# Patient Record
Sex: Female | Born: 1950 | Race: White | Hispanic: No | Marital: Married | Smoking: Former smoker
Health system: Southern US, Community
[De-identification: ages and names within clinical notes are randomized; demographics above are authoritative.]

## PROBLEM LIST (undated history)

## (undated) DIAGNOSIS — K635 Polyp of colon: Secondary | ICD-10-CM

## (undated) DIAGNOSIS — H409 Unspecified glaucoma: Secondary | ICD-10-CM

## (undated) DIAGNOSIS — T7840XA Allergy, unspecified, initial encounter: Secondary | ICD-10-CM

## (undated) DIAGNOSIS — H269 Unspecified cataract: Secondary | ICD-10-CM

## (undated) HISTORY — DX: Unspecified cataract: H26.9

## (undated) HISTORY — PX: BREAST EXCISIONAL BIOPSY: SUR124

## (undated) HISTORY — DX: Polyp of colon: K63.5

## (undated) HISTORY — DX: Allergy, unspecified, initial encounter: T78.40XA

## (undated) HISTORY — DX: Unspecified glaucoma: H40.9

## (undated) HISTORY — PX: COLONOSCOPY: SHX174

## (undated) HISTORY — PX: BREAST CYST ASPIRATION: SHX578

---

## 1999-09-01 ENCOUNTER — Encounter: Payer: Self-pay | Admitting: Gynecology

## 1999-09-01 ENCOUNTER — Encounter: Admission: RE | Admit: 1999-09-01 | Discharge: 1999-09-01 | Payer: Self-pay | Admitting: Gynecology

## 2000-09-05 ENCOUNTER — Other Ambulatory Visit: Admission: RE | Admit: 2000-09-05 | Discharge: 2000-09-05 | Payer: Self-pay | Admitting: Gynecology

## 2000-09-05 ENCOUNTER — Encounter: Admission: RE | Admit: 2000-09-05 | Discharge: 2000-09-05 | Payer: Self-pay | Admitting: Gynecology

## 2000-09-05 ENCOUNTER — Encounter: Payer: Self-pay | Admitting: Gynecology

## 2001-09-06 ENCOUNTER — Other Ambulatory Visit: Admission: RE | Admit: 2001-09-06 | Discharge: 2001-09-06 | Payer: Self-pay | Admitting: Gynecology

## 2001-11-10 ENCOUNTER — Encounter: Admission: RE | Admit: 2001-11-10 | Discharge: 2001-11-10 | Payer: Self-pay | Admitting: Gynecology

## 2001-11-10 ENCOUNTER — Encounter: Payer: Self-pay | Admitting: Gynecology

## 2001-11-15 ENCOUNTER — Encounter: Payer: Self-pay | Admitting: Gynecology

## 2001-11-15 ENCOUNTER — Encounter: Admission: RE | Admit: 2001-11-15 | Discharge: 2001-11-15 | Payer: Self-pay | Admitting: Gynecology

## 2002-09-12 ENCOUNTER — Other Ambulatory Visit: Admission: RE | Admit: 2002-09-12 | Discharge: 2002-09-12 | Payer: Self-pay | Admitting: Gynecology

## 2002-11-20 ENCOUNTER — Encounter: Admission: RE | Admit: 2002-11-20 | Discharge: 2002-11-20 | Payer: Self-pay | Admitting: Gynecology

## 2003-12-04 ENCOUNTER — Other Ambulatory Visit: Admission: RE | Admit: 2003-12-04 | Discharge: 2003-12-04 | Payer: Self-pay | Admitting: Gynecology

## 2003-12-04 ENCOUNTER — Encounter: Admission: RE | Admit: 2003-12-04 | Discharge: 2003-12-04 | Payer: Self-pay | Admitting: Gynecology

## 2004-12-23 ENCOUNTER — Other Ambulatory Visit: Admission: RE | Admit: 2004-12-23 | Discharge: 2004-12-23 | Payer: Self-pay | Admitting: Gynecology

## 2004-12-23 ENCOUNTER — Encounter: Admission: RE | Admit: 2004-12-23 | Discharge: 2004-12-23 | Payer: Self-pay | Admitting: Gynecology

## 2006-02-08 ENCOUNTER — Other Ambulatory Visit: Admission: RE | Admit: 2006-02-08 | Discharge: 2006-02-08 | Payer: Self-pay | Admitting: Gynecology

## 2006-02-08 ENCOUNTER — Encounter: Admission: RE | Admit: 2006-02-08 | Discharge: 2006-02-08 | Payer: Self-pay | Admitting: Gynecology

## 2007-02-28 ENCOUNTER — Encounter: Admission: RE | Admit: 2007-02-28 | Discharge: 2007-02-28 | Payer: Self-pay | Admitting: Gynecology

## 2008-03-04 ENCOUNTER — Encounter: Admission: RE | Admit: 2008-03-04 | Discharge: 2008-03-04 | Payer: Self-pay | Admitting: Gynecology

## 2009-03-06 ENCOUNTER — Encounter: Admission: RE | Admit: 2009-03-06 | Discharge: 2009-03-06 | Payer: Self-pay | Admitting: Gynecology

## 2010-02-07 ENCOUNTER — Encounter: Payer: Self-pay | Admitting: Gynecology

## 2010-09-24 ENCOUNTER — Other Ambulatory Visit: Payer: Self-pay | Admitting: Gynecology

## 2010-09-24 DIAGNOSIS — Z1231 Encounter for screening mammogram for malignant neoplasm of breast: Secondary | ICD-10-CM

## 2010-10-07 ENCOUNTER — Ambulatory Visit
Admission: RE | Admit: 2010-10-07 | Discharge: 2010-10-07 | Disposition: A | Discharge status: Home / Self Care | Payer: BC Managed Care – PPO | Source: Ambulatory Visit | Attending: Gynecology | Admitting: Gynecology

## 2010-10-07 DIAGNOSIS — Z1231 Encounter for screening mammogram for malignant neoplasm of breast: Secondary | ICD-10-CM

## 2011-10-01 ENCOUNTER — Other Ambulatory Visit: Payer: Self-pay | Admitting: Gynecology

## 2011-10-01 DIAGNOSIS — Z1231 Encounter for screening mammogram for malignant neoplasm of breast: Secondary | ICD-10-CM

## 2011-10-14 ENCOUNTER — Ambulatory Visit
Admission: RE | Admit: 2011-10-14 | Discharge: 2011-10-14 | Disposition: A | Discharge status: Home / Self Care | Payer: BC Managed Care – PPO | Source: Ambulatory Visit | Attending: Gynecology | Admitting: Gynecology

## 2011-10-14 DIAGNOSIS — Z1231 Encounter for screening mammogram for malignant neoplasm of breast: Secondary | ICD-10-CM

## 2014-12-16 ENCOUNTER — Other Ambulatory Visit: Payer: Self-pay

## 2014-12-16 DIAGNOSIS — Z1231 Encounter for screening mammogram for malignant neoplasm of breast: Secondary | ICD-10-CM

## 2014-12-17 ENCOUNTER — Ambulatory Visit
Admission: RE | Admit: 2014-12-17 | Discharge: 2014-12-17 | Disposition: A | Discharge status: Home / Self Care | Payer: BLUE CROSS/BLUE SHIELD | Source: Ambulatory Visit

## 2014-12-17 DIAGNOSIS — Z1231 Encounter for screening mammogram for malignant neoplasm of breast: Secondary | ICD-10-CM

## 2014-12-18 ENCOUNTER — Other Ambulatory Visit: Payer: Self-pay | Admitting: Internal Medicine

## 2014-12-18 DIAGNOSIS — R928 Other abnormal and inconclusive findings on diagnostic imaging of breast: Secondary | ICD-10-CM

## 2014-12-26 ENCOUNTER — Ambulatory Visit
Admission: RE | Admit: 2014-12-26 | Discharge: 2014-12-26 | Disposition: A | Discharge status: Home / Self Care | Payer: BLUE CROSS/BLUE SHIELD | Source: Ambulatory Visit | Attending: Internal Medicine | Admitting: Internal Medicine

## 2014-12-26 DIAGNOSIS — R928 Other abnormal and inconclusive findings on diagnostic imaging of breast: Secondary | ICD-10-CM

## 2015-12-15 ENCOUNTER — Other Ambulatory Visit: Payer: Self-pay | Admitting: Internal Medicine

## 2015-12-15 DIAGNOSIS — Z1231 Encounter for screening mammogram for malignant neoplasm of breast: Secondary | ICD-10-CM

## 2016-01-23 ENCOUNTER — Ambulatory Visit: Payer: BLUE CROSS/BLUE SHIELD

## 2016-03-04 ENCOUNTER — Ambulatory Visit
Admission: RE | Admit: 2016-03-04 | Discharge: 2016-03-04 | Disposition: A | Discharge status: Home / Self Care | Payer: BLUE CROSS/BLUE SHIELD | Source: Ambulatory Visit | Attending: Internal Medicine | Admitting: Internal Medicine

## 2016-03-04 DIAGNOSIS — Z1231 Encounter for screening mammogram for malignant neoplasm of breast: Secondary | ICD-10-CM

## 2017-04-13 ENCOUNTER — Other Ambulatory Visit: Payer: Self-pay | Admitting: Internal Medicine

## 2017-04-13 DIAGNOSIS — Z1231 Encounter for screening mammogram for malignant neoplasm of breast: Secondary | ICD-10-CM

## 2017-05-06 ENCOUNTER — Ambulatory Visit
Admission: RE | Admit: 2017-05-06 | Discharge: 2017-05-06 | Disposition: A | Discharge status: Home / Self Care | Payer: BLUE CROSS/BLUE SHIELD | Source: Ambulatory Visit | Attending: Internal Medicine | Admitting: Internal Medicine

## 2017-05-06 DIAGNOSIS — Z1231 Encounter for screening mammogram for malignant neoplasm of breast: Secondary | ICD-10-CM

## 2017-08-08 ENCOUNTER — Encounter: Payer: Self-pay | Admitting: Gastroenterology

## 2017-09-21 ENCOUNTER — Ambulatory Visit (INDEPENDENT_AMBULATORY_CARE_PROVIDER_SITE_OTHER): Payer: BLUE CROSS/BLUE SHIELD | Admitting: Gastroenterology

## 2017-09-21 ENCOUNTER — Encounter: Payer: Self-pay | Admitting: Gastroenterology

## 2017-09-21 ENCOUNTER — Other Ambulatory Visit (INDEPENDENT_AMBULATORY_CARE_PROVIDER_SITE_OTHER): Payer: BLUE CROSS/BLUE SHIELD

## 2017-09-21 ENCOUNTER — Encounter

## 2017-09-21 VITALS — BP 116/74 | HR 76 | Ht 68.0 in | Wt 141.0 lb

## 2017-09-21 DIAGNOSIS — R634 Abnormal weight loss: Secondary | ICD-10-CM

## 2017-09-21 DIAGNOSIS — R1032 Left lower quadrant pain: Secondary | ICD-10-CM | POA: Diagnosis not present

## 2017-09-21 LAB — CBC WITH DIFFERENTIAL/PLATELET
BASOS PCT: 0.4 % (ref 0.0–3.0)
Basophils Absolute: 0 10*3/uL (ref 0.0–0.1)
EOS ABS: 0.1 10*3/uL (ref 0.0–0.7)
Eosinophils Relative: 2.5 % (ref 0.0–5.0)
HCT: 41 % (ref 36.0–46.0)
Hemoglobin: 14 g/dL (ref 12.0–15.0)
Lymphocytes Relative: 30.6 % (ref 12.0–46.0)
Lymphs Abs: 1.4 10*3/uL (ref 0.7–4.0)
MCHC: 34.2 g/dL (ref 30.0–36.0)
MCV: 91 fl (ref 78.0–100.0)
MONO ABS: 0.3 10*3/uL (ref 0.1–1.0)
Monocytes Relative: 7.4 % (ref 3.0–12.0)
NEUTROS ABS: 2.7 10*3/uL (ref 1.4–7.7)
Neutrophils Relative %: 59.1 % (ref 43.0–77.0)
PLATELETS: 380 10*3/uL (ref 150.0–400.0)
RBC: 4.5 Mil/uL (ref 3.87–5.11)
RDW: 13.1 % (ref 11.5–15.5)
WBC: 4.6 10*3/uL (ref 4.0–10.5)

## 2017-09-21 LAB — COMPREHENSIVE METABOLIC PANEL
ALBUMIN: 4.8 g/dL (ref 3.5–5.2)
ALT: 27 U/L (ref 0–35)
AST: 25 U/L (ref 0–37)
Alkaline Phosphatase: 53 U/L (ref 39–117)
BUN: 16 mg/dL (ref 6–23)
CHLORIDE: 101 meq/L (ref 96–112)
CO2: 30 meq/L (ref 19–32)
CREATININE: 0.68 mg/dL (ref 0.40–1.20)
Calcium: 9.7 mg/dL (ref 8.4–10.5)
GFR: 92.32 mL/min (ref >60.00)
GLUCOSE: 99 mg/dL (ref 70–99)
Potassium: 4 mEq/L (ref 3.5–5.1)
SODIUM: 137 meq/L (ref 135–145)
Total Bilirubin: 0.5 mg/dL (ref 0.2–1.2)
Total Protein: 7.1 g/dL (ref 6.0–8.3)

## 2017-09-21 LAB — TSH: TSH: 3.31 u[IU]/mL (ref 0.35–4.50)

## 2017-09-21 MED ORDER — DICYCLOMINE HCL 20 MG PO TABS: 20.0000 mg | ORAL_TABLET | Freq: Two times a day (BID) | ORAL | 4 refills | Status: DC

## 2017-09-21 MED ORDER — DICYCLOMINE HCL 10 MG PO CAPS: 10.0000 mg | ORAL_CAPSULE | Freq: Two times a day (BID) | ORAL | 0 refills | Status: DC

## 2017-09-21 NOTE — Addendum Note (Signed)
Addended by: Karena Addison on: 09/21/2017 04:35 PM   Modules accepted: Orders

## 2017-09-21 NOTE — Patient Instructions (Signed)
If you are age 65 or older, your body mass index should be between 23-30. Your Body mass index is 21.44 kg/m. If this is out of the aforementioned range listed, please consider follow up with your Primary Care Provider.  If you are age 51 or younger, your body mass index should be between 19-25. Your Body mass index is 21.44 kg/m. If this is out of the aformentioned range listed, please consider follow up with your Primary Care Provider.  Please go to the lab on the 2nd floor [address] before you leave the office today.     You have been scheduled for a CT scan of the abdomen and pelvis at [facility] are scheduled on 09/28/17 at [facility] should arrive 15 minutes prior to your appointment time for registration. Please follow the written instructions below on the day of your exam:  WARNING: IF YOU ARE ALLERGIC TO IODINE/X-RAY DYE, PLEASE NOTIFY RADIOLOGY IMMEDIATELY AT [phone]! YOU WILL BE GIVEN A 13 HOUR PREMEDICATION PREP.  1) Do not eat or drink anything after 6am (4 hours prior to your test) 2) You have been given 2 bottles of oral contrast to drink. The solution may taste better if refrigerated, but do NOT add ice or any other liquid to this solution. Shake well before drinking.    Drink 1 bottle of contrast @ 8am (2 hours prior to your exam)  Drink 1 bottle of contrast @ 9am (1 hour prior to your exam)  You may take any medications as prescribed with a small amount of water except for the following: Metformin, Glucophage, Glucovance, Avandamet, Riomet, Fortamet, Actoplus Met, Janumet, Glumetza or Metaglip. The above medications must be held the day of the exam AND 48 hours after the exam.  The purpose of you drinking the oral contrast is to aid in the visualization of your intestinal tract. The contrast solution may cause some diarrhea. Before your exam is started, you will be given a small amount of fluid to drink. Depending on your individual set of symptoms, you may  also receive an intravenous injection of x-ray contrast/dye. Plan on being at [facility] for 30 minutes or longer, depending on the type of exam you are having performed.  This test typically takes 30-45 minutes to complete.  If you have any questions regarding your exam or if you need to reschedule, you may call the CT department at [phone] between the hours of 8:00 am and 5:00 pm, Monday-Friday.  ________________________________________________________________________  Please go to the lab on the 2nd floor [address] before you leave the office today.   We have sent the following medications to your pharmacy for you to pick up at your convenience: Bentyl  Thank you,  Dr. Jackquline Denmark

## 2017-09-21 NOTE — Progress Notes (Signed)
Chief Complaint: Abdominal pain  Referring Provider:  Dr Delena Bali      ASSESSMENT AND PLAN;   #1.  LLQ pain with intermittent diarrhea and weight loss. - Check CBC, CMP, celiac screen and TSH. - Proceed with CT scan abdomen and pelvis with p.o. and IV contrast. - Please obtain previous records (colon  - Decrease Bentyl 10mg  po bid - fu in 12 weeks.   #2. H/O colonic polyps (colonoscopy 12/02/2014 1cm proximal transverse colon tubular adenoma) -At follow-up she would need to be scheduled for colonoscopy.  She understands the risks and benefits.   HPI:    HAYDEE JABBOUR is a 67 y.o. female  Abdominal pain since June 2019-mostly left lower quadrant, somewhat relieved by defecation, associated abdominal bloating, worse after eating, no nocturnal symptoms With intermittent diarrhea, no nocturnal symptoms No fever chills melena or hematochezia. Seen by Dr Delena Bali- neg TVUS.  Started on Bentyl 20mg  po QID with some relief. Lost weight 8lb since June. Denies having any significant nausea, vomiting, heartburn, regurgitation, odynophagia or dysphagia. No recent history of travel, antibiotics, no history suggestive of lactose intolerance  Last colonoscopy 12/02/2014-colonic polyps (TA), mild sigmoid diverticulosis. Past Medical History:  Diagnosis Date  . Glaucoma    left and maybe in the right per pt    Past Surgical History:  Procedure Laterality Date  . BREAST CYST ASPIRATION    . BREAST EXCISIONAL BIOPSY      Family History  Problem Relation Age of Onset  . Congestive Heart Failure Mother   . Heart attack Mother   . Colonic polyp Father   . Congestive Heart Failure Father        Died at 35  . Colon cancer Neg Hx   . Esophageal cancer Neg Hx     Social History   Tobacco Use  . Smoking status: Former Research scientist (life sciences)  . Smokeless tobacco: Never Used  . Tobacco comment: smoked when younger  Substance Use Topics  . Alcohol use: Yes  . Drug use: Never    Current  Outpatient Medications  Medication Sig Dispense Refill  . LUMIGAN 0.01 % SOLN 1 drop at bedtime.  5  . Misc Natural Products (OSTEO BI-FLEX/5-LOXIN ADVANCED PO) Take 2 tablets by mouth daily.    . Multiple Vitamins-Minerals (MULTIVITAMIN ADULT PO) Take 1 tablet by mouth daily.    Marland Kitchen dicyclomine (BENTYL) 20 MG tablet Take 20 mg by mouth 4 (four) times daily.  0   No current facility-administered medications for this visit.     Not on File  Review of Systems:  Constitutional: Denies fever, chills, diaphoresis, appetite change, has fatigue.  HEENT: Denies photophobia, eye pain, redness, hearing loss, ear pain, congestion, sore throat, rhinorrhea, sneezing, mouth sores, neck pain, neck stiffness and tinnitus.   Respiratory: Denies SOB, DOE, cough, chest tightness,  and wheezing.   Cardiovascular: Denies chest pain, palpitations and leg swelling.  Genitourinary: Denies dysuria, urgency, frequency, hematuria, flank pain and difficulty urinating.  Musculoskeletal: Denies myalgias, back pain, joint swelling, arthralgias and gait problem.  Skin: No rash.  Neurological: Denies dizziness, seizures, syncope, weakness, light-headedness, numbness and headaches.  Hematological: Denies adenopathy. Easy bruising, personal or family bleeding history  Psychiatric/Behavioral: No anxiety or depression     Physical Exam:    BP 116/74   Pulse 76   Ht 5\' 8"  (1.727 m)   Wt 141 lb (64 kg)   BMI 21.44 kg/m  Filed Weights   09/21/17 1109  Weight: 141 lb (  64 kg)   Constitutional:  Well-developed, in no acute distress. Psychiatric: Normal mood and affect. Behavior is normal. HEENT: Pupils normal.  Conjunctivae are normal. No scleral icterus. Neck supple.  Cardiovascular: Normal rate, regular rhythm. No edema Pulmonary/chest: Effort normal and breath sounds normal. No wheezing, rales or rhonchi. Abdominal: Soft, nondistended.  Mild left lower quadrant abdominal tenderness. Bowel sounds active throughout.  There are no masses palpable. No hepatomegaly. Rectal:  defered Neurological: Alert and oriented to person place and time. Skin: Skin is warm and dry. No rashes noted.    Carmell Austria, MD 09/21/2017, 11:24 AM  Cc: Dr Delena Bali

## 2017-09-22 LAB — TISSUE TRANSGLUTAMINASE, IGA: (tTG) Ab, IgA: 1 U/mL

## 2017-09-22 LAB — IGG: IGG (IMMUNOGLOBIN G), SERUM: 904 mg/dL (ref 600–1540)

## 2017-09-23 ENCOUNTER — Telehealth: Payer: Self-pay | Admitting: Gastroenterology

## 2017-09-23 NOTE — Telephone Encounter (Signed)
Please see note below. I do not have the number for the pt to call and reschedule the CT scan. Pt just saw Dr. Lyndel Safe 09/21/17.

## 2017-09-28 ENCOUNTER — Ambulatory Visit (HOSPITAL_BASED_OUTPATIENT_CLINIC_OR_DEPARTMENT_OTHER): Payer: BLUE CROSS/BLUE SHIELD

## 2017-10-25 ENCOUNTER — Encounter: Payer: Self-pay | Admitting: Gastroenterology

## 2017-10-26 ENCOUNTER — Ambulatory Visit (HOSPITAL_BASED_OUTPATIENT_CLINIC_OR_DEPARTMENT_OTHER)
Admission: RE | Admit: 2017-10-26 | Discharge: 2017-10-26 | Disposition: A | Discharge status: Home / Self Care | Payer: Medicare HMO | Source: Ambulatory Visit | Attending: Gastroenterology | Admitting: Gastroenterology

## 2017-10-26 ENCOUNTER — Encounter (HOSPITAL_BASED_OUTPATIENT_CLINIC_OR_DEPARTMENT_OTHER): Payer: Self-pay

## 2017-10-26 DIAGNOSIS — R1032 Left lower quadrant pain: Secondary | ICD-10-CM | POA: Diagnosis not present

## 2017-10-26 DIAGNOSIS — N281 Cyst of kidney, acquired: Secondary | ICD-10-CM | POA: Diagnosis not present

## 2017-10-26 DIAGNOSIS — R634 Abnormal weight loss: Secondary | ICD-10-CM | POA: Diagnosis not present

## 2017-10-26 DIAGNOSIS — I700 Atherosclerosis of aorta: Secondary | ICD-10-CM | POA: Insufficient documentation

## 2017-10-26 MED ORDER — IOPAMIDOL (ISOVUE-300) INJECTION 61%
100.0000 mL | Freq: Once | INTRAVENOUS | Status: AC | PRN
  Administered 2017-10-26: 100 mL via INTRAVENOUS

## 2017-11-03 ENCOUNTER — Telehealth: Payer: Self-pay | Admitting: Gastroenterology

## 2017-11-03 NOTE — Telephone Encounter (Signed)
Pt wanted to know if she needed to still have colonoscopy since she had already had CT scan done. Let her know that the ct does not show the lining of the colon so we would not know if there were any polyps present. She verbalized understanding and will call back to schedule colon.

## 2017-11-07 DIAGNOSIS — D2239 Melanocytic nevi of other parts of face: Secondary | ICD-10-CM | POA: Diagnosis not present

## 2017-11-08 ENCOUNTER — Encounter: Payer: Self-pay | Admitting: Gastroenterology

## 2017-11-18 DIAGNOSIS — H401132 Primary open-angle glaucoma, bilateral, moderate stage: Secondary | ICD-10-CM | POA: Diagnosis not present

## 2017-12-02 DIAGNOSIS — M67911 Unspecified disorder of synovium and tendon, right shoulder: Secondary | ICD-10-CM | POA: Diagnosis not present

## 2017-12-08 DIAGNOSIS — M25511 Pain in right shoulder: Secondary | ICD-10-CM | POA: Diagnosis not present

## 2017-12-08 DIAGNOSIS — M25611 Stiffness of right shoulder, not elsewhere classified: Secondary | ICD-10-CM | POA: Diagnosis not present

## 2017-12-12 ENCOUNTER — Encounter: Payer: Self-pay | Admitting: Gastroenterology

## 2017-12-12 ENCOUNTER — Ambulatory Visit (AMBULATORY_SURGERY_CENTER): Payer: Self-pay | Admitting: *Deleted

## 2017-12-12 ENCOUNTER — Other Ambulatory Visit: Payer: Self-pay

## 2017-12-12 VITALS — Ht 67.5 in | Wt 146.0 lb

## 2017-12-12 DIAGNOSIS — M25511 Pain in right shoulder: Secondary | ICD-10-CM | POA: Diagnosis not present

## 2017-12-12 DIAGNOSIS — Z86010 Personal history of colonic polyps: Secondary | ICD-10-CM

## 2017-12-12 DIAGNOSIS — M25611 Stiffness of right shoulder, not elsewhere classified: Secondary | ICD-10-CM | POA: Diagnosis not present

## 2017-12-12 MED ORDER — SUPREP BOWEL PREP KIT 17.5-3.13-1.6 GM/177ML PO SOLN: 1.0000 | Freq: Once | ORAL | 0 refills | Status: AC

## 2017-12-12 NOTE — Progress Notes (Signed)
No egg or soy allergy known to patient  No issues with past sedation with any surgeries  or procedures, no intubation problems  No diet pills per patient No home 02 use per patient  No blood thinners per patient  Pt denies issues with constipation, patient takes fiber supplement for fiber, not constipation. Will stop 5 days prior.  No A fib or A flutter  EMMI video offered and declined by the patient.

## 2017-12-26 ENCOUNTER — Encounter: Payer: Self-pay | Admitting: Gastroenterology

## 2017-12-26 ENCOUNTER — Ambulatory Visit (AMBULATORY_SURGERY_CENTER): Payer: Medicare HMO | Admitting: Gastroenterology

## 2017-12-26 VITALS — BP 124/55 | HR 63 | Temp 97.1°F | Resp 10 | Ht 67.5 in | Wt 146.0 lb

## 2017-12-26 DIAGNOSIS — Z86010 Personal history of colonic polyps: Secondary | ICD-10-CM | POA: Diagnosis not present

## 2017-12-26 DIAGNOSIS — Z800 Family history of malignant neoplasm of digestive organs: Secondary | ICD-10-CM

## 2017-12-26 MED ORDER — SODIUM CHLORIDE 0.9 % IV SOLN: 500.0000 mL | Freq: Once | INTRAVENOUS | Status: DC

## 2017-12-26 NOTE — Patient Instructions (Signed)
Discharge instructions given. Handouts on Diverticulosis and hemorrhoids. Resume previous medications. YOU HAD AN ENDOSCOPIC PROCEDURE TODAY AT [facility]:   Refer to the procedure report that was given to you for any specific questions about what was found during the examination.  If the procedure report does not answer your questions, please call your gastroenterologist to clarify.  If you requested that your care partner not be given the details of your procedure findings, then the procedure report has been included in a sealed envelope for you to review at your convenience later.  YOU SHOULD EXPECT: Some feelings of bloating in the abdomen. Passage of more gas than usual.  Walking can help get rid of the air that was put into your GI tract during the procedure and reduce the bloating. If you had a lower endoscopy (such as a colonoscopy or flexible sigmoidoscopy) you may notice spotting of blood in your stool or on the toilet paper. If you underwent a bowel prep for your procedure, you may not have a normal bowel movement for a few days.  Please Note:  You might notice some irritation and congestion in your nose or some drainage.  This is from the oxygen used during your procedure.  There is no need for concern and it should clear up in a day or so.  SYMPTOMS TO REPORT IMMEDIATELY:   Following lower endoscopy (colonoscopy or flexible sigmoidoscopy):  Excessive amounts of blood in the stool  Significant tenderness or worsening of abdominal pains  Swelling of the abdomen that is new, acute  Fever of 100F or higher   For urgent or emergent issues, a gastroenterologist can be reached at any hour by calling [phone].   DIET:  We do recommend a small meal at first, but then you may proceed to your regular diet.  Drink plenty of fluids but you should avoid alcoholic beverages for 24 hours.  ACTIVITY:  You should plan to take it easy for the rest of today and you should  NOT DRIVE or use heavy machinery until tomorrow (because of the sedation medicines used during the test).    FOLLOW UP: Our staff will call the number listed on your records the next business day following your procedure to check on you and address any questions or concerns that you may have regarding the information given to you following your procedure. If we do not reach you, we will leave a message.  However, if you are feeling well and you are not experiencing any problems, there is no need to return our call.  We will assume that you have returned to your regular daily activities without incident.  If any biopsies were taken you will be contacted by phone or by letter within the next 1-3 weeks.  Please call us at [phone] if you have not heard about the biopsies in 3 weeks.    SIGNATURES/CONFIDENTIALITY: You and/or your care partner have signed paperwork which will be entered into your electronic medical record.  These signatures attest to the fact that that the information above on your After Visit Summary has been reviewed and is understood.  Full responsibility of the confidentiality of this discharge information lies with you and/or your care-partner.

## 2017-12-26 NOTE — Progress Notes (Signed)
Report given to PACU, vss 

## 2017-12-26 NOTE — Op Note (Signed)
[name] Procedure Date: 12/26/2017 10:32 AM MRN: [id number] Endoscopist: Jackquline Denmark , MD Age: 67 Referring MD:  Date of Birth: 12/07/1950 Gender: Female Account #: [id number] Procedure:                Colonoscopy Indications:              High risk colon cancer surveillance: Personal                            history of colonic polyps 11/2014. Family history                            of colonic polyps. Medicines:                Monitored Anesthesia Care Procedure:                Pre-Anesthesia Assessment:                           - Prior to the procedure, a History and Physical                            was performed, and patient medications and                            allergies were reviewed. The patient's tolerance of                            previous anesthesia was also reviewed. The risks                            and benefits of the procedure and the sedation                            options and risks were discussed with the patient.                            All questions were answered, and informed consent                            was obtained. Prior Anticoagulants: The patient has                            taken no previous anticoagulant or antiplatelet                            agents. ASA Grade Assessment: I - A normal, healthy                            patient. After reviewing the risks and benefits,                            the patient was deemed in satisfactory condition to  undergo the procedure.                           After obtaining informed consent, the colonoscope                            was passed under direct vision. Throughout the                            procedure, the patient's blood pressure, pulse, and                            oxygen saturations were monitored continuously. The                            Model CF-HQ190L [id number]) scope was introduced                       through the anus and advanced to the 2 cm into the                            ileum. The colonoscopy was performed without                            difficulty. The patient tolerated the procedure                            well. The quality of the bowel preparation was                            excellent. The colon was redundant. Scope In: 10:41:26 AM Scope Out: 10:53:05 AM Scope Withdrawal Time: 0 hours 5 minutes 57 seconds  Total Procedure Duration: 0 hours 11 minutes 39 seconds  Findings:                 A few rare small-mouthed diverticula were found in                            the sigmoid colon.                           Non-bleeding internal hemorrhoids were found during                            retroflexion. The hemorrhoids were small.                           The exam was otherwise without abnormality on                            direct and retroflexion views. Complications:            No immediate complications. Estimated Blood Loss:     Estimated blood loss: none. Impression:               -Minimal sigmoid diverticulosis.                           -  Small internal hemorrhoids.                           -Otherwise normal colonoscopy to TI. Recommendation:           - Patient has a contact number available for                            emergencies. The signs and symptoms of potential                            delayed complications were discussed with the                            patient. Return to normal activities tomorrow.                            Written discharge instructions were provided to the                            patient.                           - Resume previous diet.                           - Continue present medications.                           - Repeat colonoscopy in 10 years for screening                            purposes. Earlier, if with any new problems or if                            there is any change in family  history.                           - Return to GI clinic PRN. Jackquline Denmark, MD 12/26/2017 10:57:35 AM This report has been signed electronically.

## 2017-12-27 ENCOUNTER — Telehealth: Payer: Self-pay

## 2017-12-27 DIAGNOSIS — J019 Acute sinusitis, unspecified: Secondary | ICD-10-CM | POA: Diagnosis not present

## 2017-12-27 NOTE — Telephone Encounter (Signed)
  Follow up Call-  Call back number 12/26/2017  Post procedure Call Back phone  # [phone]  Permission to leave phone message Yes  Some recent data might be hidden     Patient questions:  Do you have a fever, pain , or abdominal swelling? No. Pain Score  0 *  Have you tolerated food without any problems? Yes.    Have you been able to return to your normal activities? Yes.    Do you have any questions about your discharge instructions: Diet   No. Medications  No. Follow up visit  No.  Do you have questions or concerns about your Care? No.  Actions: * If pain score is 4 or above: No action needed, pain <4.

## 2018-01-30 DIAGNOSIS — H9191 Unspecified hearing loss, right ear: Secondary | ICD-10-CM | POA: Diagnosis not present

## 2018-01-30 DIAGNOSIS — H6121 Impacted cerumen, right ear: Secondary | ICD-10-CM | POA: Diagnosis not present

## 2018-01-30 DIAGNOSIS — H938X1 Other specified disorders of right ear: Secondary | ICD-10-CM | POA: Diagnosis not present

## 2018-01-30 DIAGNOSIS — M26609 Unspecified temporomandibular joint disorder, unspecified side: Secondary | ICD-10-CM | POA: Diagnosis not present

## 2018-01-30 DIAGNOSIS — J358 Other chronic diseases of tonsils and adenoids: Secondary | ICD-10-CM | POA: Diagnosis not present

## 2018-01-30 DIAGNOSIS — J069 Acute upper respiratory infection, unspecified: Secondary | ICD-10-CM | POA: Diagnosis not present

## 2018-04-28 DIAGNOSIS — L255 Unspecified contact dermatitis due to plants, except food: Secondary | ICD-10-CM | POA: Diagnosis not present

## 2018-07-07 DIAGNOSIS — H25813 Combined forms of age-related cataract, bilateral: Secondary | ICD-10-CM | POA: Diagnosis not present

## 2018-08-11 DIAGNOSIS — Z01419 Encounter for gynecological examination (general) (routine) without abnormal findings: Secondary | ICD-10-CM | POA: Diagnosis not present

## 2018-08-14 DIAGNOSIS — H25812 Combined forms of age-related cataract, left eye: Secondary | ICD-10-CM | POA: Diagnosis not present

## 2018-08-14 DIAGNOSIS — H25811 Combined forms of age-related cataract, right eye: Secondary | ICD-10-CM | POA: Diagnosis not present

## 2018-08-14 DIAGNOSIS — H25813 Combined forms of age-related cataract, bilateral: Secondary | ICD-10-CM | POA: Diagnosis not present

## 2018-08-14 DIAGNOSIS — H33311 Horseshoe tear of retina without detachment, right eye: Secondary | ICD-10-CM | POA: Diagnosis not present

## 2018-08-14 DIAGNOSIS — H401131 Primary open-angle glaucoma, bilateral, mild stage: Secondary | ICD-10-CM | POA: Diagnosis not present

## 2018-09-01 DIAGNOSIS — Z01818 Encounter for other preprocedural examination: Secondary | ICD-10-CM | POA: Diagnosis not present

## 2018-09-01 DIAGNOSIS — H409 Unspecified glaucoma: Secondary | ICD-10-CM | POA: Diagnosis not present

## 2018-09-01 DIAGNOSIS — H401131 Primary open-angle glaucoma, bilateral, mild stage: Secondary | ICD-10-CM | POA: Diagnosis not present

## 2018-09-01 DIAGNOSIS — H401111 Primary open-angle glaucoma, right eye, mild stage: Secondary | ICD-10-CM | POA: Diagnosis not present

## 2018-09-01 DIAGNOSIS — H25811 Combined forms of age-related cataract, right eye: Secondary | ICD-10-CM | POA: Diagnosis not present

## 2018-09-14 DIAGNOSIS — H25812 Combined forms of age-related cataract, left eye: Secondary | ICD-10-CM | POA: Diagnosis not present

## 2018-09-14 DIAGNOSIS — H401121 Primary open-angle glaucoma, left eye, mild stage: Secondary | ICD-10-CM | POA: Diagnosis not present

## 2018-09-14 DIAGNOSIS — H401131 Primary open-angle glaucoma, bilateral, mild stage: Secondary | ICD-10-CM | POA: Diagnosis not present

## 2018-09-14 DIAGNOSIS — H25811 Combined forms of age-related cataract, right eye: Secondary | ICD-10-CM | POA: Diagnosis not present

## 2018-10-23 ENCOUNTER — Other Ambulatory Visit: Payer: Self-pay | Admitting: Internal Medicine

## 2018-10-23 DIAGNOSIS — Z1231 Encounter for screening mammogram for malignant neoplasm of breast: Secondary | ICD-10-CM

## 2018-10-26 ENCOUNTER — Ambulatory Visit: Payer: Medicare HMO

## 2018-11-29 DIAGNOSIS — Z1331 Encounter for screening for depression: Secondary | ICD-10-CM | POA: Diagnosis not present

## 2018-11-29 DIAGNOSIS — Z961 Presence of intraocular lens: Secondary | ICD-10-CM | POA: Diagnosis not present

## 2018-11-29 DIAGNOSIS — E785 Hyperlipidemia, unspecified: Secondary | ICD-10-CM | POA: Diagnosis not present

## 2018-11-29 DIAGNOSIS — Z0000 Encounter for general adult medical examination without abnormal findings: Secondary | ICD-10-CM | POA: Diagnosis not present

## 2018-11-29 DIAGNOSIS — Z9181 History of falling: Secondary | ICD-10-CM | POA: Diagnosis not present

## 2018-11-29 DIAGNOSIS — Z139 Encounter for screening, unspecified: Secondary | ICD-10-CM | POA: Diagnosis not present

## 2018-11-29 DIAGNOSIS — Z1231 Encounter for screening mammogram for malignant neoplasm of breast: Secondary | ICD-10-CM | POA: Diagnosis not present

## 2018-12-07 ENCOUNTER — Ambulatory Visit: Payer: Medicare HMO

## 2018-12-13 DIAGNOSIS — E559 Vitamin D deficiency, unspecified: Secondary | ICD-10-CM | POA: Diagnosis not present

## 2018-12-13 DIAGNOSIS — Z0000 Encounter for general adult medical examination without abnormal findings: Secondary | ICD-10-CM | POA: Diagnosis not present

## 2018-12-13 DIAGNOSIS — Z23 Encounter for immunization: Secondary | ICD-10-CM | POA: Diagnosis not present

## 2019-01-29 ENCOUNTER — Ambulatory Visit
Admission: RE | Admit: 2019-01-29 | Discharge: 2019-01-29 | Disposition: A | Discharge status: Home / Self Care | Payer: Medicare HMO | Source: Ambulatory Visit | Attending: Internal Medicine | Admitting: Internal Medicine

## 2019-01-29 ENCOUNTER — Other Ambulatory Visit: Payer: Self-pay

## 2019-01-29 DIAGNOSIS — Z1231 Encounter for screening mammogram for malignant neoplasm of breast: Secondary | ICD-10-CM

## 2019-02-18 DIAGNOSIS — Z20828 Contact with and (suspected) exposure to other viral communicable diseases: Secondary | ICD-10-CM | POA: Diagnosis not present

## 2019-06-05 DIAGNOSIS — Z961 Presence of intraocular lens: Secondary | ICD-10-CM | POA: Diagnosis not present

## 2019-11-03 DIAGNOSIS — J040 Acute laryngitis: Secondary | ICD-10-CM | POA: Diagnosis not present

## 2019-11-09 DIAGNOSIS — Z20822 Contact with and (suspected) exposure to covid-19: Secondary | ICD-10-CM | POA: Diagnosis not present

## 2019-11-09 DIAGNOSIS — R6889 Other general symptoms and signs: Secondary | ICD-10-CM | POA: Diagnosis not present

## 2019-12-03 DIAGNOSIS — E785 Hyperlipidemia, unspecified: Secondary | ICD-10-CM | POA: Diagnosis not present

## 2019-12-03 DIAGNOSIS — Z1331 Encounter for screening for depression: Secondary | ICD-10-CM | POA: Diagnosis not present

## 2019-12-03 DIAGNOSIS — Z9181 History of falling: Secondary | ICD-10-CM | POA: Diagnosis not present

## 2019-12-03 DIAGNOSIS — Z0000 Encounter for general adult medical examination without abnormal findings: Secondary | ICD-10-CM | POA: Diagnosis not present

## 2019-12-06 DIAGNOSIS — H401132 Primary open-angle glaucoma, bilateral, moderate stage: Secondary | ICD-10-CM | POA: Diagnosis not present

## 2019-12-12 DIAGNOSIS — N959 Unspecified menopausal and perimenopausal disorder: Secondary | ICD-10-CM | POA: Diagnosis not present

## 2019-12-12 DIAGNOSIS — M85851 Other specified disorders of bone density and structure, right thigh: Secondary | ICD-10-CM | POA: Diagnosis not present

## 2019-12-14 DIAGNOSIS — E559 Vitamin D deficiency, unspecified: Secondary | ICD-10-CM | POA: Diagnosis not present

## 2019-12-14 DIAGNOSIS — Z139 Encounter for screening, unspecified: Secondary | ICD-10-CM | POA: Diagnosis not present

## 2019-12-14 DIAGNOSIS — Z0000 Encounter for general adult medical examination without abnormal findings: Secondary | ICD-10-CM | POA: Diagnosis not present

## 2020-01-04 ENCOUNTER — Other Ambulatory Visit: Payer: Self-pay | Admitting: Internal Medicine

## 2020-01-04 DIAGNOSIS — Z1231 Encounter for screening mammogram for malignant neoplasm of breast: Secondary | ICD-10-CM

## 2020-02-15 ENCOUNTER — Other Ambulatory Visit: Payer: Self-pay

## 2020-02-15 ENCOUNTER — Ambulatory Visit
Admission: RE | Admit: 2020-02-15 | Discharge: 2020-02-15 | Disposition: A | Discharge status: Home / Self Care | Payer: Medicare HMO | Source: Ambulatory Visit | Attending: Internal Medicine | Admitting: Internal Medicine

## 2020-02-15 DIAGNOSIS — Z1231 Encounter for screening mammogram for malignant neoplasm of breast: Secondary | ICD-10-CM | POA: Diagnosis not present

## 2020-02-20 DIAGNOSIS — H401132 Primary open-angle glaucoma, bilateral, moderate stage: Secondary | ICD-10-CM | POA: Diagnosis not present

## 2020-06-05 DIAGNOSIS — H401132 Primary open-angle glaucoma, bilateral, moderate stage: Secondary | ICD-10-CM | POA: Diagnosis not present

## 2020-12-23 DIAGNOSIS — Z0000 Encounter for general adult medical examination without abnormal findings: Secondary | ICD-10-CM | POA: Diagnosis not present

## 2020-12-26 DIAGNOSIS — Z1331 Encounter for screening for depression: Secondary | ICD-10-CM | POA: Diagnosis not present

## 2020-12-26 DIAGNOSIS — Z0000 Encounter for general adult medical examination without abnormal findings: Secondary | ICD-10-CM | POA: Diagnosis not present

## 2020-12-26 DIAGNOSIS — Z139 Encounter for screening, unspecified: Secondary | ICD-10-CM | POA: Diagnosis not present

## 2020-12-26 DIAGNOSIS — E785 Hyperlipidemia, unspecified: Secondary | ICD-10-CM | POA: Diagnosis not present

## 2020-12-26 DIAGNOSIS — Z9181 History of falling: Secondary | ICD-10-CM | POA: Diagnosis not present

## 2021-04-01 ENCOUNTER — Other Ambulatory Visit: Payer: Self-pay | Admitting: Internal Medicine

## 2021-04-01 DIAGNOSIS — Z1231 Encounter for screening mammogram for malignant neoplasm of breast: Secondary | ICD-10-CM

## 2021-04-14 ENCOUNTER — Ambulatory Visit
Admission: RE | Admit: 2021-04-14 | Discharge: 2021-04-14 | Disposition: A | Discharge status: Home / Self Care | Payer: Medicare HMO | Source: Ambulatory Visit | Attending: Internal Medicine | Admitting: Internal Medicine

## 2021-04-14 DIAGNOSIS — Z1231 Encounter for screening mammogram for malignant neoplasm of breast: Secondary | ICD-10-CM

## 2021-04-15 DIAGNOSIS — H401132 Primary open-angle glaucoma, bilateral, moderate stage: Secondary | ICD-10-CM | POA: Diagnosis not present

## 2021-07-16 DIAGNOSIS — H401132 Primary open-angle glaucoma, bilateral, moderate stage: Secondary | ICD-10-CM | POA: Diagnosis not present

## 2021-07-16 DIAGNOSIS — H524 Presbyopia: Secondary | ICD-10-CM | POA: Diagnosis not present

## 2021-11-10 IMAGING — MG MM DIGITAL SCREENING BILAT W/ TOMO AND CAD
8 series · 8 of 24 positions shown · non-contrast
Comparison: Previous exam(s).

CLINICAL DATA: Screening. History of RIGHT breast excisional biopsy
in 0884.

EXAM:
DIGITAL SCREENING BILATERAL MAMMOGRAM WITH TOMO AND CAD

[L MLO synth-2D]
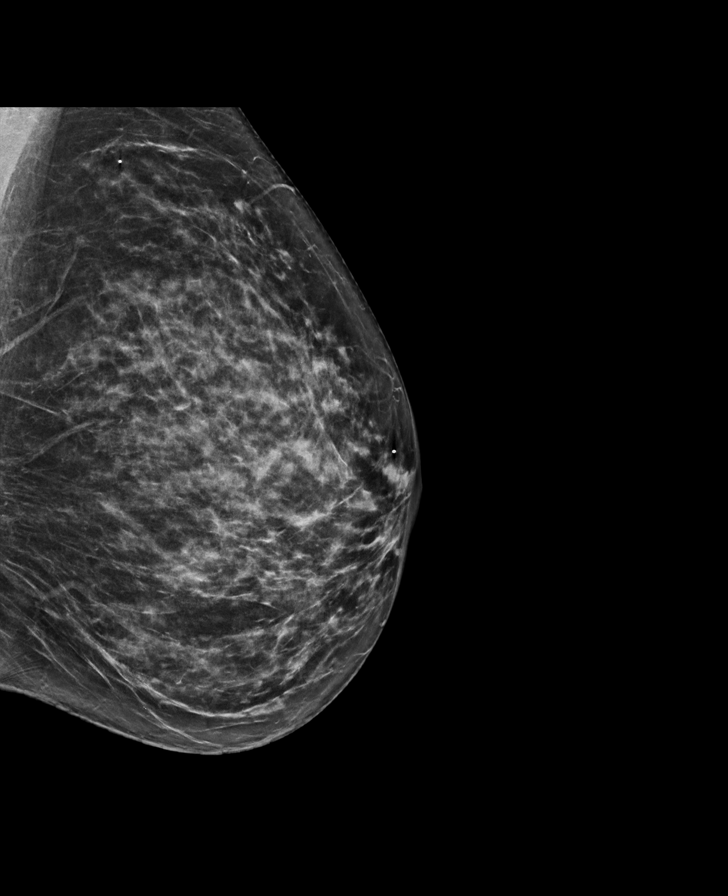

[R CC synth-2D]
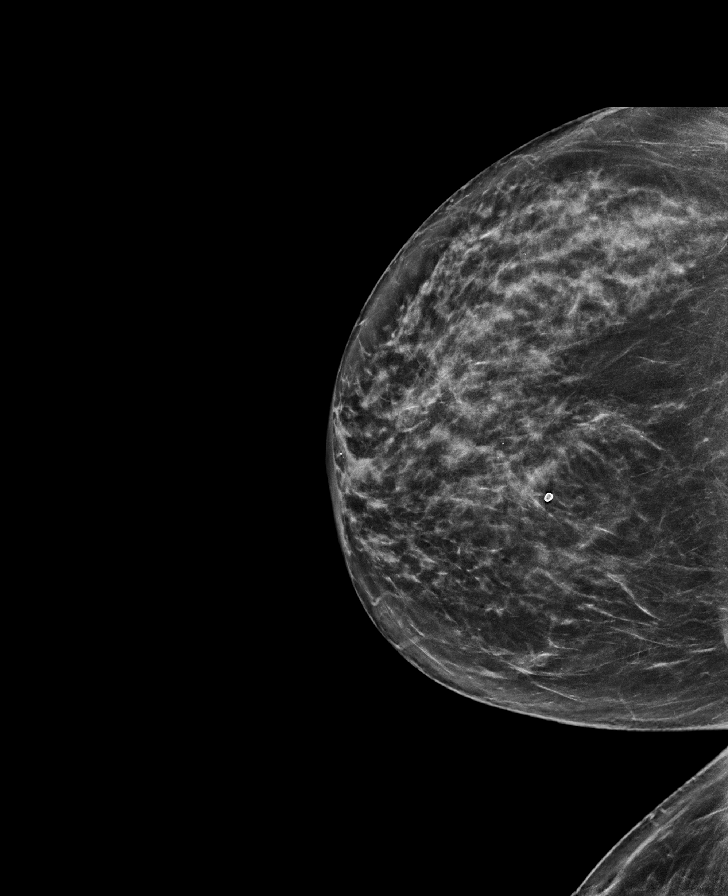

[L CC synth-2D]
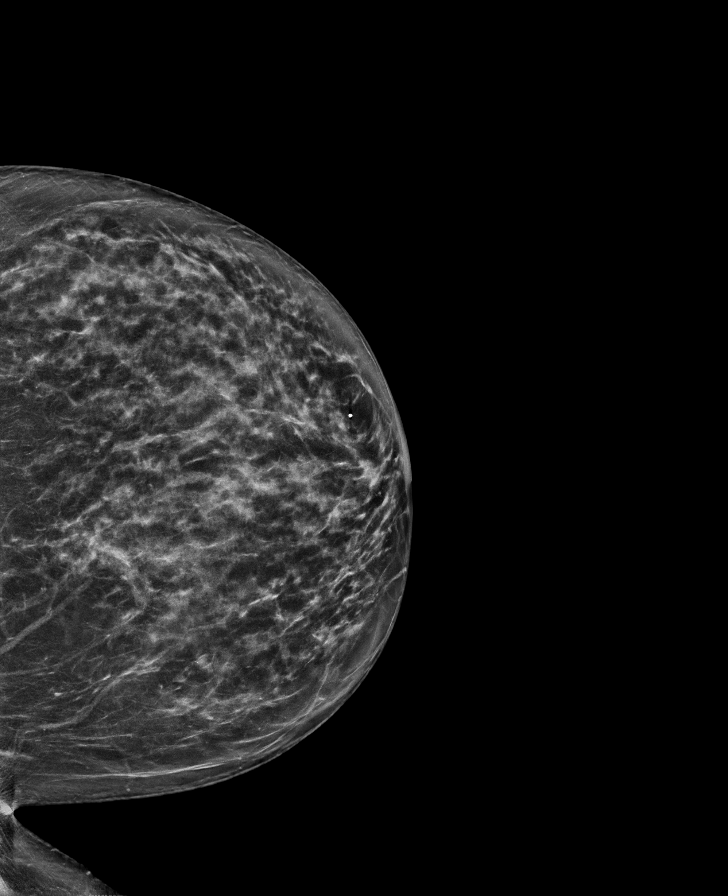

[R MLO synth-2D]
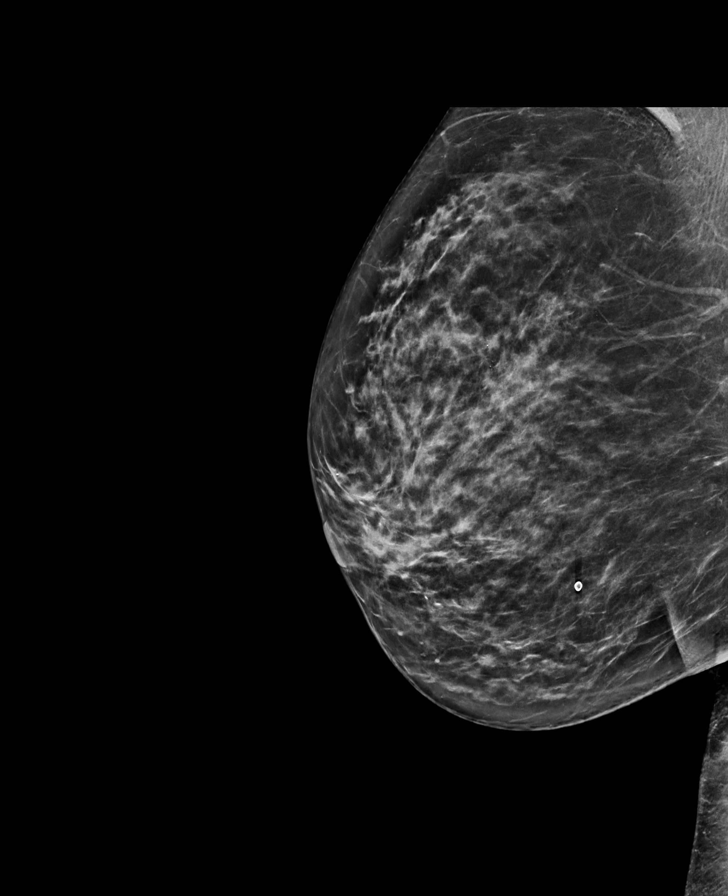

[L MLO tomo · tomo slice 36/71.0]
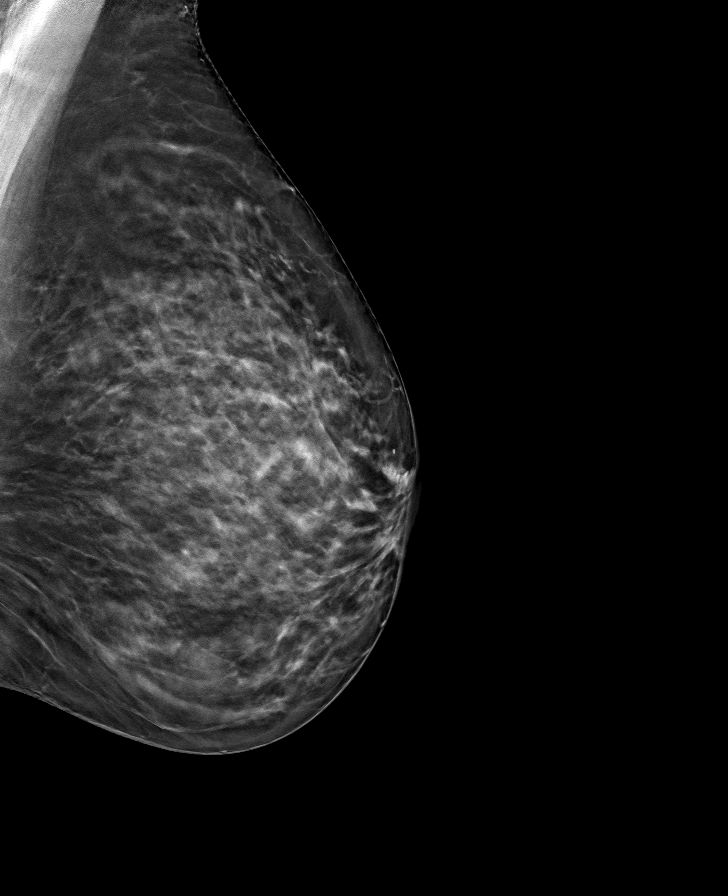

[L CC tomo · tomo slice 36/71.0]
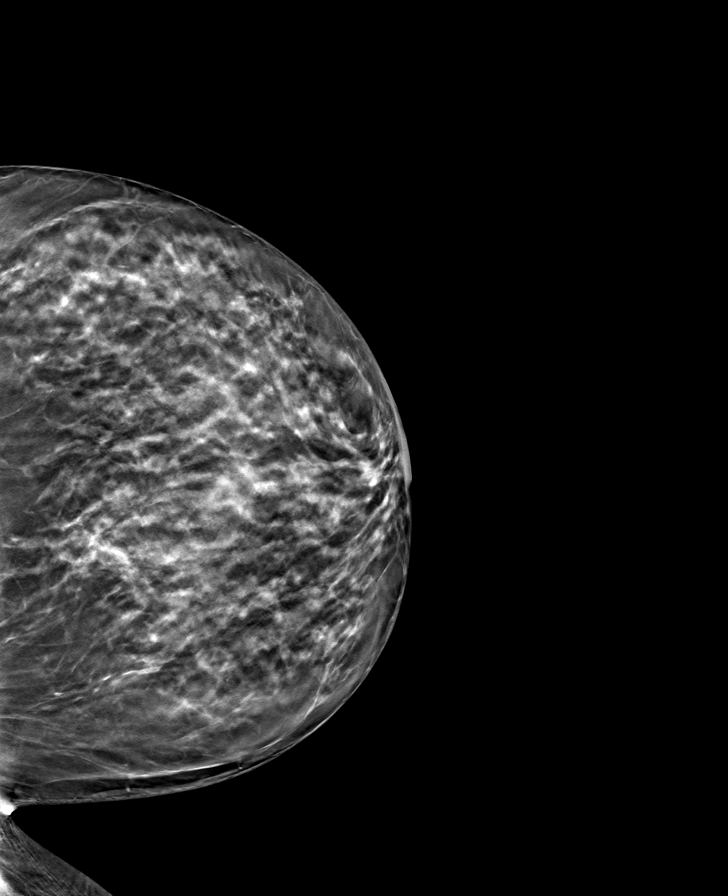

[R CC tomo · tomo slice 37/72.0]
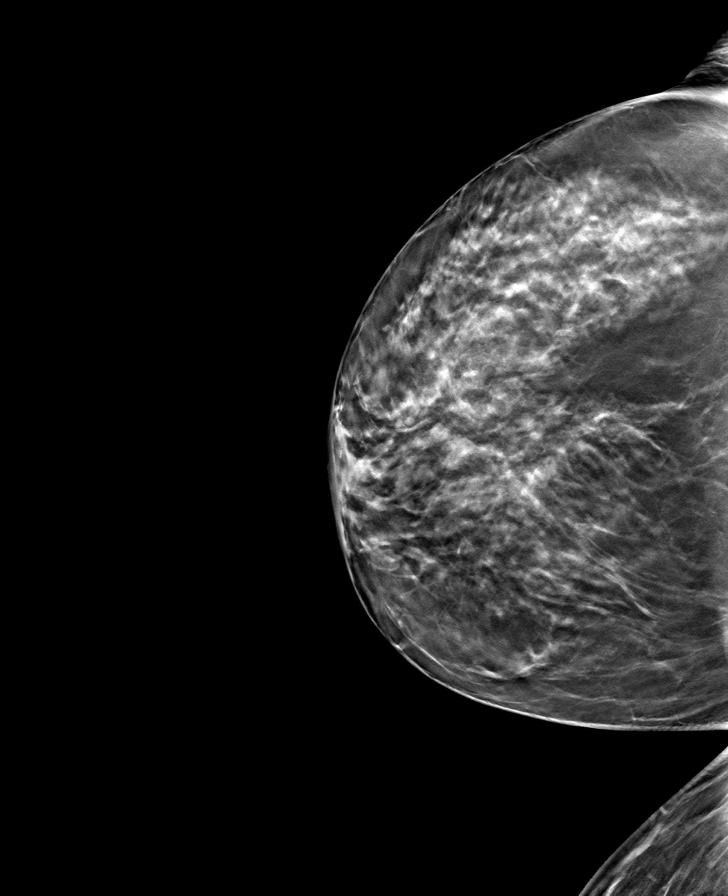

[R MLO tomo · tomo slice 37/72.0]
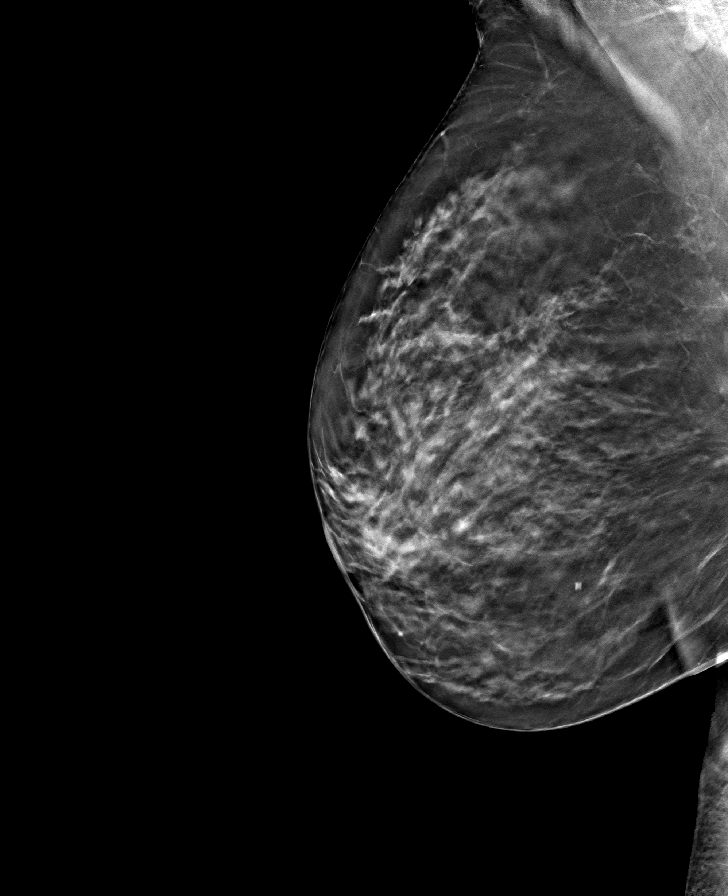

[8 of 24 positions shown; findings below may reference images not displayed]

ACR Breast Density Category c: The breast tissue is heterogeneously
dense, which may obscure small masses.
FINDINGS: There are no findings suspicious for malignancy. The images were
evaluated with computer-aided detection.
IMPRESSION: No mammographic evidence of malignancy. A result letter of this
screening mammogram will be mailed directly to the patient.

RECOMMENDATION:
Screening mammogram in one year. (Code:FU-Q-TDI)

BI-RADS CATEGORY  1: Negative.

## 2021-12-24 DIAGNOSIS — Z0000 Encounter for general adult medical examination without abnormal findings: Secondary | ICD-10-CM | POA: Diagnosis not present

## 2021-12-24 DIAGNOSIS — F3342 Major depressive disorder, recurrent, in full remission: Secondary | ICD-10-CM | POA: Diagnosis not present

## 2021-12-24 DIAGNOSIS — Z23 Encounter for immunization: Secondary | ICD-10-CM | POA: Diagnosis not present

## 2021-12-24 DIAGNOSIS — Z6823 Body mass index (BMI) 23.0-23.9, adult: Secondary | ICD-10-CM | POA: Diagnosis not present

## 2022-04-12 ENCOUNTER — Other Ambulatory Visit: Payer: Self-pay | Admitting: Internal Medicine

## 2022-04-12 DIAGNOSIS — Z0000 Encounter for general adult medical examination without abnormal findings: Secondary | ICD-10-CM

## 2022-04-12 DIAGNOSIS — H401132 Primary open-angle glaucoma, bilateral, moderate stage: Secondary | ICD-10-CM | POA: Diagnosis not present

## 2022-05-26 ENCOUNTER — Ambulatory Visit
Admission: RE | Admit: 2022-05-26 | Discharge: 2022-05-26 | Disposition: A | Discharge status: Home / Self Care | Payer: Medicare HMO | Source: Ambulatory Visit | Attending: Internal Medicine | Admitting: Internal Medicine

## 2022-05-26 DIAGNOSIS — Z1231 Encounter for screening mammogram for malignant neoplasm of breast: Secondary | ICD-10-CM | POA: Diagnosis not present

## 2022-05-26 DIAGNOSIS — Z0000 Encounter for general adult medical examination without abnormal findings: Secondary | ICD-10-CM

## 2022-07-07 DIAGNOSIS — L209 Atopic dermatitis, unspecified: Secondary | ICD-10-CM | POA: Diagnosis not present

## 2022-07-14 DIAGNOSIS — L03114 Cellulitis of left upper limb: Secondary | ICD-10-CM | POA: Diagnosis not present

## 2022-07-14 DIAGNOSIS — S40862A Insect bite (nonvenomous) of left upper arm, initial encounter: Secondary | ICD-10-CM | POA: Diagnosis not present

## 2022-07-14 DIAGNOSIS — W57XXXA Bitten or stung by nonvenomous insect and other nonvenomous arthropods, initial encounter: Secondary | ICD-10-CM | POA: Diagnosis not present

## 2022-07-31 DIAGNOSIS — W57XXXA Bitten or stung by nonvenomous insect and other nonvenomous arthropods, initial encounter: Secondary | ICD-10-CM | POA: Diagnosis not present

## 2022-07-31 DIAGNOSIS — L03113 Cellulitis of right upper limb: Secondary | ICD-10-CM | POA: Diagnosis not present

## 2022-07-31 DIAGNOSIS — S40861A Insect bite (nonvenomous) of right upper arm, initial encounter: Secondary | ICD-10-CM | POA: Diagnosis not present

## 2022-10-12 DIAGNOSIS — H401132 Primary open-angle glaucoma, bilateral, moderate stage: Secondary | ICD-10-CM | POA: Diagnosis not present

## 2022-12-29 DIAGNOSIS — Z23 Encounter for immunization: Secondary | ICD-10-CM | POA: Diagnosis not present

## 2022-12-29 DIAGNOSIS — E559 Vitamin D deficiency, unspecified: Secondary | ICD-10-CM | POA: Diagnosis not present

## 2022-12-29 DIAGNOSIS — E785 Hyperlipidemia, unspecified: Secondary | ICD-10-CM | POA: Diagnosis not present

## 2022-12-29 DIAGNOSIS — Z139 Encounter for screening, unspecified: Secondary | ICD-10-CM | POA: Diagnosis not present

## 2022-12-29 DIAGNOSIS — Z0000 Encounter for general adult medical examination without abnormal findings: Secondary | ICD-10-CM | POA: Diagnosis not present

## 2023-04-25 DIAGNOSIS — H401132 Primary open-angle glaucoma, bilateral, moderate stage: Secondary | ICD-10-CM | POA: Diagnosis not present

## 2023-05-24 DIAGNOSIS — M19071 Primary osteoarthritis, right ankle and foot: Secondary | ICD-10-CM | POA: Diagnosis not present

## 2023-05-24 DIAGNOSIS — M8589 Other specified disorders of bone density and structure, multiple sites: Secondary | ICD-10-CM | POA: Diagnosis not present

## 2023-05-24 DIAGNOSIS — M79671 Pain in right foot: Secondary | ICD-10-CM | POA: Diagnosis not present

## 2023-10-04 DIAGNOSIS — Z0000 Encounter for general adult medical examination without abnormal findings: Secondary | ICD-10-CM | POA: Diagnosis not present

## 2023-10-04 DIAGNOSIS — Z9181 History of falling: Secondary | ICD-10-CM | POA: Diagnosis not present

## 2024-01-06 ENCOUNTER — Other Ambulatory Visit: Payer: Self-pay | Admitting: Internal Medicine

## 2024-01-06 DIAGNOSIS — Z1231 Encounter for screening mammogram for malignant neoplasm of breast: Secondary | ICD-10-CM

## 2024-02-02 ENCOUNTER — Ambulatory Visit
Admission: RE | Admit: 2024-02-02 | Discharge: 2024-02-02 | Disposition: A | Source: Ambulatory Visit | Attending: Internal Medicine | Admitting: Internal Medicine

## 2024-02-02 DIAGNOSIS — Z1231 Encounter for screening mammogram for malignant neoplasm of breast: Secondary | ICD-10-CM
